# Patient Record
Sex: Male | Born: 1970 | Race: Black or African American | Hispanic: No | Marital: Single | Smoking: Never smoker
Health system: Southern US, Community
[De-identification: ages and names within clinical notes are randomized; demographics above are authoritative.]

## PROBLEM LIST (undated history)

## (undated) DIAGNOSIS — K219 Gastro-esophageal reflux disease without esophagitis: Secondary | ICD-10-CM

## (undated) HISTORY — DX: Gastro-esophageal reflux disease without esophagitis: K21.9

---

## 2016-09-18 ENCOUNTER — Emergency Department (HOSPITAL_COMMUNITY)
Admission: EM | Admit: 2016-09-18 | Discharge: 2016-09-19 | Disposition: A | Discharge status: Home / Self Care | Payer: No Typology Code available for payment source | Attending: Emergency Medicine | Admitting: Emergency Medicine

## 2016-09-18 ENCOUNTER — Encounter (HOSPITAL_COMMUNITY): Payer: Self-pay

## 2016-09-18 DIAGNOSIS — S199XXA Unspecified injury of neck, initial encounter: Secondary | ICD-10-CM | POA: Diagnosis present

## 2016-09-18 DIAGNOSIS — Y92410 Unspecified street and highway as the place of occurrence of the external cause: Secondary | ICD-10-CM | POA: Diagnosis not present

## 2016-09-18 DIAGNOSIS — F17200 Nicotine dependence, unspecified, uncomplicated: Secondary | ICD-10-CM | POA: Diagnosis not present

## 2016-09-18 DIAGNOSIS — S161XXA Strain of muscle, fascia and tendon at neck level, initial encounter: Secondary | ICD-10-CM

## 2016-09-18 DIAGNOSIS — Y999 Unspecified external cause status: Secondary | ICD-10-CM | POA: Insufficient documentation

## 2016-09-18 DIAGNOSIS — S39012A Strain of muscle, fascia and tendon of lower back, initial encounter: Secondary | ICD-10-CM

## 2016-09-18 DIAGNOSIS — Y939 Activity, unspecified: Secondary | ICD-10-CM | POA: Diagnosis not present
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
## 2016-09-19 ENCOUNTER — Emergency Department (HOSPITAL_COMMUNITY): Payer: No Typology Code available for payment source

## 2016-09-19 MED ORDER — IBUPROFEN 800 MG PO TABS
800.0000 mg | ORAL_TABLET | Freq: Once | ORAL | Status: AC
  Administered 2016-09-19: 800 mg via ORAL
  Filled 2016-09-19: qty 1

## 2016-09-19 MED ORDER — IBUPROFEN 800 MG PO TABS: 800.0000 mg | ORAL_TABLET | Freq: Three times a day (TID) | ORAL | 0 refills | Status: DC

## 2016-09-19 MED ORDER — METHOCARBAMOL 500 MG PO TABS: 500.0000 mg | ORAL_TABLET | Freq: Two times a day (BID) | ORAL | 0 refills | Status: DC
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
## 2019-04-04 ENCOUNTER — Encounter: Payer: Self-pay | Admitting: Emergency Medicine

## 2019-04-04 ENCOUNTER — Ambulatory Visit (INDEPENDENT_AMBULATORY_CARE_PROVIDER_SITE_OTHER): Payer: Self-pay | Admitting: Emergency Medicine

## 2019-04-04 ENCOUNTER — Other Ambulatory Visit: Payer: Self-pay

## 2019-04-04 VITALS — BP 103/65 | HR 60 | Temp 98.1°F | Resp 16 | Ht 67.0 in | Wt 210.8 lb

## 2019-04-04 DIAGNOSIS — Z7689 Persons encountering health services in other specified circumstances: Secondary | ICD-10-CM

## 2019-04-04 DIAGNOSIS — R4582 Worries: Secondary | ICD-10-CM

## 2019-04-04 NOTE — Patient Instructions (Addendum)
   If you have lab work done today you will be contacted with your lab results within the next 2 weeks.  If you have not heard from us then please contact us. The fastest way to get your results is to register for My Chart.   IF you received an x-ray today, you will receive an invoice from [facility]. Please contact [facility] at [phone] with questions or concerns regarding your invoice.   IF you received labwork today, you will receive an invoice from [facility]. Please contact [facility] at [phone] with questions or concerns regarding your invoice.   Our billing staff will not be able to assist you with questions regarding bills from these companies.  You will be contacted with the lab results as soon as they are available. The fastest way to get your results is to activate your My Chart account. Instructions are located on the last page of this paperwork. If you have not heard from us regarding the results in 2 weeks, please contact this office.     Health Maintenance, Male Adopting a healthy lifestyle and getting preventive care are important in promoting health and wellness. Ask your health care provider about:  The right schedule for you to have regular tests and exams.  Things you can do on your own to prevent diseases and keep yourself healthy. What should I know about diet, weight, and exercise? Eat a healthy diet   Eat a diet that includes plenty of vegetables, fruits, low-fat dairy products, and lean protein.  Do not eat a lot of foods that are high in solid fats, added sugars, or sodium. Maintain a healthy weight Body mass index (BMI) is a measurement that can be used to identify possible weight problems. It estimates body fat based on height and weight. Your health care provider can help determine your BMI and help you achieve or maintain a healthy weight. Get regular exercise Get regular exercise. This is one of the most important things you  can do for your health. Most adults should:  Exercise for at least 150 minutes each week. The exercise should increase your heart rate and make you sweat (moderate-intensity exercise).  Do strengthening exercises at least twice a week. This is in addition to the moderate-intensity exercise.  Spend less time sitting. Even light physical activity can be beneficial. Watch cholesterol and blood lipids Have your blood tested for lipids and cholesterol at 48 years of age, then have this test every 5 years. You may need to have your cholesterol levels checked more often if:  Your lipid or cholesterol levels are high.  You are older than 48 years of age.  You are at high risk for heart disease. What should I know about cancer screening? Many types of cancers can be detected early and may often be prevented. Depending on your health history and family history, you may need to have cancer screening at various ages. This may include screening for:  Colorectal cancer.  Prostate cancer.  Skin cancer.  Lung cancer. What should I know about heart disease, diabetes, and high blood pressure? Blood pressure and heart disease  High blood pressure causes heart disease and increases the risk of stroke. This is more likely to develop in people who have high blood pressure readings, are of African descent, or are overweight.  Talk with your health care provider about your target blood pressure readings.  Have your blood pressure checked: ? Every 3-5 years if you are 18-39 years   of age. ? Every year if you are 40 years old or older.  If you are between the ages of 65 and 75 and are a current or former smoker, ask your health care provider if you should have a one-time screening for abdominal aortic aneurysm (AAA). Diabetes Have regular diabetes screenings. This checks your fasting blood sugar level. Have the screening done:  Once every three years after age 45 if you are at a normal weight and have  a low risk for diabetes.  More often and at a younger age if you are overweight or have a high risk for diabetes. What should I know about preventing infection? Hepatitis B If you have a higher risk for hepatitis B, you should be screened for this virus. Talk with your health care provider to find out if you are at risk for hepatitis B infection. Hepatitis C Blood testing is recommended for:  Everyone born from 1945 through 1965.  Anyone with known risk factors for hepatitis C. Sexually transmitted infections (STIs)  You should be screened each year for STIs, including gonorrhea and chlamydia, if: ? You are sexually active and are younger than 48 years of age. ? You are older than 48 years of age and your health care provider tells you that you are at risk for this type of infection. ? Your sexual activity has changed since you were last screened, and you are at increased risk for chlamydia or gonorrhea. Ask your health care provider if you are at risk.  Ask your health care provider about whether you are at high risk for HIV. Your health care provider may recommend a prescription medicine to help prevent HIV infection. If you choose to take medicine to prevent HIV, you should first get tested for HIV. You should then be tested every 3 months for as long as you are taking the medicine. Follow these instructions at home: Lifestyle  Do not use any products that contain nicotine or tobacco, such as cigarettes, e-cigarettes, and chewing tobacco. If you need help quitting, ask your health care provider.  Do not use street drugs.  Do not share needles.  Ask your health care provider for help if you need support or information about quitting drugs. Alcohol use  Do not drink alcohol if your health care provider tells you not to drink.  If you drink alcohol: ? Limit how much you have to 0-2 drinks a day. ? Be aware of how much alcohol is in your drink. In the [place] one drink equals one 12  oz bottle of beer (355 mL), one 5 oz glass of wine (148 mL), or one 1 oz glass of hard liquor (44 mL). General instructions  Schedule regular health, dental, and eye exams.  Stay current with your vaccines.  Tell your health care provider if: ? You often feel depressed. ? You have ever been abused or do not feel safe at home. Summary  Adopting a healthy lifestyle and getting preventive care are important in promoting health and wellness.  Follow your health care provider's instructions about healthy diet, exercising, and getting tested or screened for diseases.  Follow your health care provider's instructions on monitoring your cholesterol and blood pressure. This information is not intended to replace advice given to you by your health care provider. Make sure you discuss any questions you have with your health care provider. Document Released: 01/17/2008 Document Revised: 07/14/2018 Document Reviewed: 07/14/2018 [employer]vier Patient Education  2020 [employer]  

## 2019-04-04 NOTE — Progress Notes (Signed)
Ryan Patel 48 y.o.   Chief Complaint  Patient presents with  . Establish Care    to discuss the prostate exam  . family of diabetes    HISTORY OF PRESENT ILLNESS: This is a 48 y.o. male here to establish care with me.  First visit to this office. Healthy male with a healthy lifestyle. Has no complaints or medical concerns today. Family history of diabetes.  Inquiring about prostate and colon cancer.  HPI   Prior to Admission medications   Medication Sig Start Date End Date Taking? Authorizing Provider  BLACK CURRANT SEED OIL PO Take by mouth as needed.   Yes [provider]  Omega-3 Fatty Acids (FISH OIL PO) Take by mouth as needed.   Yes [provider]  VITAMIN D PO Take by mouth as needed.   Yes [provider]  ibuprofen (ADVIL,MOTRIN) 800 MG tablet Take 1 tablet (800 mg total) by mouth 3 (three) times daily. Patient not taking: Reported on 04/04/2019 09/19/16   Frederica Kuster, PA-C  methocarbamol (ROBAXIN) 500 MG tablet Take 1 tablet (500 mg total) by mouth 2 (two) times daily. Patient not taking: Reported on 04/04/2019 09/19/16   Frederica Kuster, PA-C    Not on File  There are no active problems to display for this patient.   History reviewed. No pertinent past medical history.  History reviewed. No pertinent surgical history.  Social History   Socioeconomic History  . Marital status: Single    Spouse name: Not on file  . Number of children: Not on file  . Years of education: Not on file  . Highest education level: Not on file  Occupational History  . Not on file  Social Needs  . Financial resource strain: Not on file  . Food insecurity    Worry: Not on file    Inability: Not on file  . Transportation needs    Medical: Not on file    Non-medical: Not on file  Tobacco Use  . Smoking status: Current Every Day Smoker  . Smokeless tobacco: Never Used  Substance and Sexual Activity  . Alcohol use: No  . Drug use: Not on  file  . Sexual activity: Not on file  Lifestyle  . Physical activity    Days per week: Not on file    Minutes per session: Not on file  . Stress: Not on file  Relationships  . Social Herbalist on phone: Not on file    Gets together: Not on file    Attends religious service: Not on file    Active member of club or organization: Not on file    Attends meetings of clubs or organizations: Not on file    Relationship status: Not on file  . Intimate partner violence    Fear of current or ex partner: Not on file    Emotionally abused: Not on file    Physically abused: Not on file    Forced sexual activity: Not on file  Other Topics Concern  . Not on file  Social History Narrative  . Not on file    History reviewed. No pertinent family history.   Review of Systems  Constitutional: Negative.  Negative for chills, fever and weight loss.  HENT: Negative.  Negative for congestion and sore throat.   Eyes: Negative.   Respiratory: Negative.  Negative for cough and shortness of breath.   Cardiovascular: Negative.  Negative for chest pain and palpitations.  Gastrointestinal: Negative.  Negative for abdominal pain, blood in stool, diarrhea, melena, nausea and vomiting.  Genitourinary: Negative.  Negative for dysuria.  Musculoskeletal: Negative for myalgias and neck pain.  Skin: Negative.  Negative for rash.  Neurological: Negative for dizziness and headaches.  All other systems reviewed and are negative.  Vitals:   04/04/19 1331  BP: 103/65  Pulse: 60  Resp: 16  Temp: 98.1 F (36.7 C)  SpO2: 100%     Physical Exam Vitals signs reviewed.  Constitutional:      Appearance: Normal appearance.  HENT:     Head: Normocephalic.     Mouth/Throat:     Mouth: Mucous membranes are moist.     Pharynx: Oropharynx is clear.  Eyes:     Extraocular Movements: Extraocular movements intact.     Conjunctiva/sclera: Conjunctivae normal.     Pupils: Pupils are equal, round, and  reactive to light.  Neck:     Musculoskeletal: Normal range of motion and neck supple.  Cardiovascular:     Rate and Rhythm: Normal rate and regular rhythm.     Pulses: Normal pulses.     Heart sounds: Normal heart sounds.  Pulmonary:     Effort: Pulmonary effort is normal.     Breath sounds: Normal breath sounds.  Abdominal:     Palpations: Abdomen is soft. There is no mass.     Tenderness: There is no abdominal tenderness. There is no guarding.  Musculoskeletal: Normal range of motion.     Right lower leg: No edema.     Left lower leg: No edema.  Skin:    General: Skin is warm and dry.     Capillary Refill: Capillary refill takes less than 2 seconds.  Neurological:     General: No focal deficit present.     Mental Status: He is alert and oriented to person, place, and time.  Psychiatric:        Mood and Affect: Mood normal.        Behavior: Behavior normal.      ASSESSMENT & PLAN: Terald was seen today for establish care and family of diabetes.  Diagnoses and all orders for this visit:  Worries  Encounter to establish care    Patient Instructions       If you have lab work done today you will be contacted with your lab results within the next 2 weeks.  If you have not heard from [place] then please contact us. The fastest way to get your results is to register for My Chart.   IF you received an x-ray today, you will receive an invoice from [facility]. Please contact [facility] at [phone] with questions or concerns regarding your invoice.   IF you received labwork today, you will receive an invoice from [place]. Please contact [facility] at [phone] with questions or concerns regarding your invoice.   Our billing staff will not be able to assist you with questions regarding bills from these companies.  You will be contacted with the lab results as soon as they are available. The fastest way to get your results is to activate your My  Chart account. Instructions are located on the last page of this paperwork. If you have not heard from [place] regarding the results in 2 weeks, please contact this office.     Health Maintenance, Male Adopting a healthy lifestyle and getting preventive care are important in promoting health and wellness. Ask your health care provider about:  The right  schedule for you to have regular tests and exams.  Things you can do on your own to prevent diseases and keep yourself healthy. What should I know about diet, weight, and exercise? Eat a healthy diet   Eat a diet that includes plenty of vegetables, fruits, low-fat dairy products, and lean protein.  Do not eat a lot of foods that are high in solid fats, added sugars, or sodium. Maintain a healthy weight Body mass index (BMI) is a measurement that can be used to identify possible weight problems. It estimates body fat based on height and weight. Your health care provider can help determine your BMI and help you achieve or maintain a healthy weight. Get regular exercise Get regular exercise. This is one of the most important things you can do for your health. Most adults should:  Exercise for at least 150 minutes each week. The exercise should increase your heart rate and make you sweat (moderate-intensity exercise).  Do strengthening exercises at least twice a week. This is in addition to the moderate-intensity exercise.  Spend less time sitting. Even light physical activity can be beneficial. Watch cholesterol and blood lipids Have your blood tested for lipids and cholesterol at 48 years of age, then have this test every 5 years. You may need to have your cholesterol levels checked more often if:  Your lipid or cholesterol levels are high.  You are older than 48 years of age.  You are at high risk for heart disease. What should I know about cancer screening? Many types of cancers can be detected early and may often be prevented.  Depending on your health history and family history, you may need to have cancer screening at various ages. This may include screening for:  Colorectal cancer.  Prostate cancer.  Skin cancer.  Lung cancer. What should I know about heart disease, diabetes, and high blood pressure? Blood pressure and heart disease  High blood pressure causes heart disease and increases the risk of stroke. This is more likely to develop in people who have high blood pressure readings, are of African descent, or are overweight.  Talk with your health care provider about your target blood pressure readings.  Have your blood pressure checked: ? Every 3-5 years if you are 22-81 years of age. ? Every year if you are 50 years old or older.  If you are between the ages of 29 and 80 and are a current or former smoker, ask your health care provider if you should have a one-time screening for abdominal aortic aneurysm (AAA). Diabetes Have regular diabetes screenings. This checks your fasting blood sugar level. Have the screening done:  Once every three years after age 48 if you are at a normal weight and have a low risk for diabetes.  More often and at a younger age if you are overweight or have a high risk for diabetes. What should I know about preventing infection? Hepatitis B If you have a higher risk for hepatitis B, you should be screened for this virus. Talk with your health care provider to find out if you are at risk for hepatitis B infection. Hepatitis C Blood testing is recommended for:  Everyone born from [birth year] through 1965.  Anyone with known risk factors for hepatitis C. Sexually transmitted infections (STIs)  You should be screened each year for STIs, including gonorrhea and chlamydia, if: ? You are sexually active and are younger than 48 years of age. ? You are older than 48 years  of age and your health care provider tells you that you are at risk for this type of infection. ? Your sexual  activity has changed since you were last screened, and you are at increased risk for chlamydia or gonorrhea. Ask your health care provider if you are at risk.  Ask your health care provider about whether you are at high risk for HIV. Your health care provider may recommend a prescription medicine to help prevent HIV infection. If you choose to take medicine to prevent HIV, you should first get tested for HIV. You should then be tested every 3 months for as long as you are taking the medicine. Follow these instructions at home: Lifestyle  Do not use any products that contain nicotine or tobacco, such as cigarettes, e-cigarettes, and chewing tobacco. If you need help quitting, ask your health care provider.  Do not use street drugs.  Do not share needles.  Ask your health care provider for help if you need support or information about quitting drugs. Alcohol use  Do not drink alcohol if your health care provider tells you not to drink.  If you drink alcohol: ? Limit how much you have to 0-2 drinks a day. ? Be aware of how much alcohol is in your drink. In the [place] one drink equals one 12 oz bottle of beer (355 mL), one 5 oz glass of wine (148 mL), or one 1 oz glass of hard liquor (44 mL). General instructions  Schedule regular health, dental, and eye exams.  Stay current with your vaccines.  Tell your health care provider if: ? You often feel depressed. ? You have ever been abused or do not feel safe at home. Summary  Adopting a healthy lifestyle and getting preventive care are important in promoting health and wellness.  Follow your health care provider's instructions about healthy diet, exercising, and getting tested or screened for diseases.  Follow your health care provider's instructions on monitoring your cholesterol and blood pressure. This information is not intended to replace advice given to you by your health care provider. Make sure you discuss any questions you have  with your health care provider. Document Released: 01/17/2008 Document Revised: 07/14/2018 Document Reviewed: 07/14/2018 Elsevier Patient Education  2020 Elsevier Inc.      Agustina Caroli, MD [facility]

## 2019-04-06 ENCOUNTER — Ambulatory Visit (INDEPENDENT_AMBULATORY_CARE_PROVIDER_SITE_OTHER): Payer: Self-pay | Admitting: Emergency Medicine

## 2019-04-06 ENCOUNTER — Encounter: Payer: Self-pay | Admitting: Emergency Medicine

## 2019-04-06 ENCOUNTER — Other Ambulatory Visit: Payer: Self-pay

## 2019-04-06 VITALS — BP 110/60 | HR 60 | Temp 97.9°F | Ht 66.0 in | Wt 206.7 lb

## 2019-04-06 DIAGNOSIS — Z0000 Encounter for general adult medical examination without abnormal findings: Secondary | ICD-10-CM

## 2019-04-06 DIAGNOSIS — Z13228 Encounter for screening for other metabolic disorders: Secondary | ICD-10-CM

## 2019-04-06 DIAGNOSIS — Z1329 Encounter for screening for other suspected endocrine disorder: Secondary | ICD-10-CM

## 2019-04-06 DIAGNOSIS — Z125 Encounter for screening for malignant neoplasm of prostate: Secondary | ICD-10-CM

## 2019-04-06 DIAGNOSIS — Z130 Encounter for screening for diseases of the blood and blood-forming organs and certain disorders involving the immune mechanism: Secondary | ICD-10-CM

## 2019-04-06 DIAGNOSIS — Z13220 Encounter for screening for lipoid disorders: Secondary | ICD-10-CM

## 2019-04-06 NOTE — Progress Notes (Signed)
Donia Ast 48 y.o.   Chief Complaint  Patient presents with  . Annual Exam    CPE    HISTORY OF PRESENT ILLNESS: This is a 48 y.o. male here for his annual exam. No chronic medical problems with no chronic medications. Healthy male with a healthy lifestyle. No complaints or medical concerns today.  HPI   Prior to Admission medications   Medication Sig Start Date End Date Taking? Authorizing Provider  BLACK CURRANT SEED OIL PO Take by mouth as needed.   Yes [provider]  Omega-3 Fatty Acids (FISH OIL PO) Take by mouth as needed.   Yes [provider]  VITAMIN D PO Take by mouth as needed.   Yes [provider]    No Known Allergies  There are no active problems to display for this patient.   History reviewed. No pertinent past medical history.  History reviewed. No pertinent surgical history.  Social History   Socioeconomic History  . Marital status: Single    Spouse name: Not on file  . Number of children: Not on file  . Years of education: Not on file  . Highest education level: Not on file  Occupational History  . Not on file  Social Needs  . Financial resource strain: Not on file  . Food insecurity    Worry: Not on file    Inability: Not on file  . Transportation needs    Medical: Not on file    Non-medical: Not on file  Tobacco Use  . Smoking status: Current Every Day Smoker  . Smokeless tobacco: Never Used  Substance and Sexual Activity  . Alcohol use: No  . Drug use: Not on file  . Sexual activity: Not on file  Lifestyle  . Physical activity    Days per week: Not on file    Minutes per session: Not on file  . Stress: Not on file  Relationships  . Social Herbalist on phone: Not on file    Gets together: Not on file    Attends religious service: Not on file    Active member of club or organization: Not on file    Attends meetings of clubs or organizations: Not on file    Relationship status: Not  on file  . Intimate partner violence    Fear of current or ex partner: Not on file    Emotionally abused: Not on file    Physically abused: Not on file    Forced sexual activity: Not on file  Other Topics Concern  . Not on file  Social History Narrative  . Not on file    History reviewed. No pertinent family history.   Review of Systems  Constitutional: Negative.  Negative for chills, fever and weight loss.  HENT: Negative.  Negative for congestion and sore throat.   Eyes: Negative.  Negative for blurred vision and double vision.  Respiratory: Negative.  Negative for cough and shortness of breath.   Cardiovascular: Negative.  Negative for chest pain, palpitations and leg swelling.  Gastrointestinal: Negative.  Negative for abdominal pain, blood in stool, diarrhea, melena, nausea and vomiting.  Genitourinary: Negative.  Negative for dysuria and hematuria.  Musculoskeletal: Negative for back pain, myalgias and neck pain.  Skin: Negative.  Negative for rash.  Neurological: Negative.  Negative for dizziness and headaches.  Endo/Heme/Allergies: Negative.   All other systems reviewed and are negative.  Vitals:   04/06/19 0849  BP: 110/60  Pulse:  60  Temp: 97.9 F (36.6 C)  SpO2: 95%     Physical Exam Vitals signs reviewed.  Constitutional:      Appearance: Normal appearance.  HENT:     Head: Normocephalic.  Eyes:     Extraocular Movements: Extraocular movements intact.     Conjunctiva/sclera: Conjunctivae normal.     Pupils: Pupils are equal, round, and reactive to light.  Neck:     Musculoskeletal: Normal range of motion and neck supple.  Cardiovascular:     Rate and Rhythm: Normal rate and regular rhythm.     Pulses: Normal pulses.     Heart sounds: Normal heart sounds.  Pulmonary:     Effort: Pulmonary effort is normal.     Breath sounds: Normal breath sounds.  Abdominal:     General: There is no distension.     Palpations: Abdomen is soft. There is no mass.      Tenderness: There is no abdominal tenderness. There is no guarding.  Musculoskeletal: Normal range of motion.  Skin:    General: Skin is warm and dry.     Capillary Refill: Capillary refill takes less than 2 seconds.  Neurological:     General: No focal deficit present.     Mental Status: He is alert and oriented to person, place, and time.  Psychiatric:        Mood and Affect: Mood normal.        Behavior: Behavior normal.      ASSESSMENT & PLAN: Nahzir was seen today for annual exam.  Diagnoses and all orders for this visit:  Routine general medical examination at a health care facility  Prostate cancer screening -     PSA(Must document that pt has been informed of limitations of PSA testing.)  Screening for deficiency anemia -     CBC with Differential  Screening for lipoid disorders -     Lipid panel  Screening for endocrine, metabolic and immunity disorder -     Hemoglobin A1c -     Comprehensive metabolic panel    Patient Instructions       If you have lab work done today you will be contacted with your lab results within the next 2 weeks.  If you have not heard from [place] then please contact us. The fastest way to get your results is to register for My Chart.   IF you received an x-ray today, you will receive an invoice from [facility]. Please contact [facility] at [phone] with questions or concerns regarding your invoice.   IF you received labwork today, you will receive an invoice from [place]. Please contact [facility] at [phone] with questions or concerns regarding your invoice.   Our billing staff will not be able to assist you with questions regarding bills from these companies.  You will be contacted with the lab results as soon as they are available. The fastest way to get your results is to activate your My Chart account. Instructions are located on the last page of this paperwork. If you have not heard from [place]  regarding the results in 2 weeks, please contact this office.       Health Maintenance, Male Adopting a healthy lifestyle and getting preventive care are important in promoting health and wellness. Ask your health care provider about:  The right schedule for you to have regular tests and exams.  Things you can do on your own to prevent diseases and keep yourself healthy. What should I  know about diet, weight, and exercise? Eat a healthy diet   Eat a diet that includes plenty of vegetables, fruits, low-fat dairy products, and lean protein.  Do not eat a lot of foods that are high in solid fats, added sugars, or sodium. Maintain a healthy weight Body mass index (BMI) is a measurement that can be used to identify possible weight problems. It estimates body fat based on height and weight. Your health care provider can help determine your BMI and help you achieve or maintain a healthy weight. Get regular exercise Get regular exercise. This is one of the most important things you can do for your health. Most adults should:  Exercise for at least 150 minutes each week. The exercise should increase your heart rate and make you sweat (moderate-intensity exercise).  Do strengthening exercises at least twice a week. This is in addition to the moderate-intensity exercise.  Spend less time sitting. Even light physical activity can be beneficial. Watch cholesterol and blood lipids Have your blood tested for lipids and cholesterol at 48 years of age, then have this test every 5 years. You may need to have your cholesterol levels checked more often if:  Your lipid or cholesterol levels are high.  You are older than 48 years of age.  You are at high risk for heart disease. What should I know about cancer screening? Many types of cancers can be detected early and may often be prevented. Depending on your health history and family history, you may need to have cancer screening at various ages.  This may include screening for:  Colorectal cancer.  Prostate cancer.  Skin cancer.  Lung cancer. What should I know about heart disease, diabetes, and high blood pressure? Blood pressure and heart disease  High blood pressure causes heart disease and increases the risk of stroke. This is more likely to develop in people who have high blood pressure readings, are of African descent, or are overweight.  Talk with your health care provider about your target blood pressure readings.  Have your blood pressure checked: ? Every 3-5 years if you are 23-39 years of age. ? Every year if you are 11 years old or older.  If you are between the ages of 45 and 52 and are a current or former smoker, ask your health care provider if you should have a one-time screening for abdominal aortic aneurysm (AAA). Diabetes Have regular diabetes screenings. This checks your fasting blood sugar level. Have the screening done:  Once every three years after age 38 if you are at a normal weight and have a low risk for diabetes.  More often and at a younger age if you are overweight or have a high risk for diabetes. What should I know about preventing infection? Hepatitis B If you have a higher risk for hepatitis B, you should be screened for this virus. Talk with your health care provider to find out if you are at risk for hepatitis B infection. Hepatitis C Blood testing is recommended for:  Everyone born from [birth year] through 1965.  Anyone with known risk factors for hepatitis C. Sexually transmitted infections (STIs)  You should be screened each year for STIs, including gonorrhea and chlamydia, if: ? You are sexually active and are younger than 48 years of age. ? You are older than 48 years of age and your health care provider tells you that you are at risk for this type of infection. ? Your sexual activity has changed since you  were last screened, and you are at increased risk for chlamydia or gonorrhea.  Ask your health care provider if you are at risk.  Ask your health care provider about whether you are at high risk for HIV. Your health care provider may recommend a prescription medicine to help prevent HIV infection. If you choose to take medicine to prevent HIV, you should first get tested for HIV. You should then be tested every 3 months for as long as you are taking the medicine. Follow these instructions at home: Lifestyle  Do not use any products that contain nicotine or tobacco, such as cigarettes, e-cigarettes, and chewing tobacco. If you need help quitting, ask your health care provider.  Do not use street drugs.  Do not share needles.  Ask your health care provider for help if you need support or information about quitting drugs. Alcohol use  Do not drink alcohol if your health care provider tells you not to drink.  If you drink alcohol: ? Limit how much you have to 0-2 drinks a day. ? Be aware of how much alcohol is in your drink. In the [place] one drink equals one 12 oz bottle of beer (355 mL), one 5 oz glass of wine (148 mL), or one 1 oz glass of hard liquor (44 mL). General instructions  Schedule regular health, dental, and eye exams.  Stay current with your vaccines.  Tell your health care provider if: ? You often feel depressed. ? You have ever been abused or do not feel safe at home. Summary  Adopting a healthy lifestyle and getting preventive care are important in promoting health and wellness.  Follow your health care provider's instructions about healthy diet, exercising, and getting tested or screened for diseases.  Follow your health care provider's instructions on monitoring your cholesterol and blood pressure. This information is not intended to replace advice given to you by your health care provider. Make sure you discuss any questions you have with your health care provider. Document Released: 01/17/2008 Document Revised: 07/14/2018 Document Reviewed:  07/14/2018 Elsevier Patient Education  2020 Elsevier Inc.      Agustina Caroli, MD [facility]

## 2019-04-06 NOTE — Patient Instructions (Addendum)
   If you have lab work done today you will be contacted with your lab results within the next 2 weeks.  If you have not heard from us then please contact us. The fastest way to get your results is to register for My Chart.   IF you received an x-ray today, you will receive an invoice from [facility]. Please contact [facility] at [phone] with questions or concerns regarding your invoice.   IF you received labwork today, you will receive an invoice from [facility]. Please contact [facility] at [phone] with questions or concerns regarding your invoice.   Our billing staff will not be able to assist you with questions regarding bills from these companies.  You will be contacted with the lab results as soon as they are available. The fastest way to get your results is to activate your My Chart account. Instructions are located on the last page of this paperwork. If you have not heard from us regarding the results in 2 weeks, please contact this office.     Health Maintenance, Male Adopting a healthy lifestyle and getting preventive care are important in promoting health and wellness. Ask your health care provider about:  The right schedule for you to have regular tests and exams.  Things you can do on your own to prevent diseases and keep yourself healthy. What should I know about diet, weight, and exercise? Eat a healthy diet   Eat a diet that includes plenty of vegetables, fruits, low-fat dairy products, and lean protein.  Do not eat a lot of foods that are high in solid fats, added sugars, or sodium. Maintain a healthy weight Body mass index (BMI) is a measurement that can be used to identify possible weight problems. It estimates body fat based on height and weight. Your health care provider can help determine your BMI and help you achieve or maintain a healthy weight. Get regular exercise Get regular exercise. This is one of the most important things you  can do for your health. Most adults should:  Exercise for at least 150 minutes each week. The exercise should increase your heart rate and make you sweat (moderate-intensity exercise).  Do strengthening exercises at least twice a week. This is in addition to the moderate-intensity exercise.  Spend less time sitting. Even light physical activity can be beneficial. Watch cholesterol and blood lipids Have your blood tested for lipids and cholesterol at 48 years of age, then have this test every 5 years. You may need to have your cholesterol levels checked more often if:  Your lipid or cholesterol levels are high.  You are older than 48 years of age.  You are at high risk for heart disease. What should I know about cancer screening? Many types of cancers can be detected early and may often be prevented. Depending on your health history and family history, you may need to have cancer screening at various ages. This may include screening for:  Colorectal cancer.  Prostate cancer.  Skin cancer.  Lung cancer. What should I know about heart disease, diabetes, and high blood pressure? Blood pressure and heart disease  High blood pressure causes heart disease and increases the risk of stroke. This is more likely to develop in people who have high blood pressure readings, are of African descent, or are overweight.  Talk with your health care provider about your target blood pressure readings.  Have your blood pressure checked: ? Every 3-5 years if you are 18-39 years   of age. ? Every year if you are 40 years old or older.  If you are between the ages of 65 and 75 and are a current or former smoker, ask your health care provider if you should have a one-time screening for abdominal aortic aneurysm (AAA). Diabetes Have regular diabetes screenings. This checks your fasting blood sugar level. Have the screening done:  Once every three years after age 45 if you are at a normal weight and have  a low risk for diabetes.  More often and at a younger age if you are overweight or have a high risk for diabetes. What should I know about preventing infection? Hepatitis B If you have a higher risk for hepatitis B, you should be screened for this virus. Talk with your health care provider to find out if you are at risk for hepatitis B infection. Hepatitis C Blood testing is recommended for:  Everyone born from 1945 through 1965.  Anyone with known risk factors for hepatitis C. Sexually transmitted infections (STIs)  You should be screened each year for STIs, including gonorrhea and chlamydia, if: ? You are sexually active and are younger than 48 years of age. ? You are older than 48 years of age and your health care provider tells you that you are at risk for this type of infection. ? Your sexual activity has changed since you were last screened, and you are at increased risk for chlamydia or gonorrhea. Ask your health care provider if you are at risk.  Ask your health care provider about whether you are at high risk for HIV. Your health care provider may recommend a prescription medicine to help prevent HIV infection. If you choose to take medicine to prevent HIV, you should first get tested for HIV. You should then be tested every 3 months for as long as you are taking the medicine. Follow these instructions at home: Lifestyle  Do not use any products that contain nicotine or tobacco, such as cigarettes, e-cigarettes, and chewing tobacco. If you need help quitting, ask your health care provider.  Do not use street drugs.  Do not share needles.  Ask your health care provider for help if you need support or information about quitting drugs. Alcohol use  Do not drink alcohol if your health care provider tells you not to drink.  If you drink alcohol: ? Limit how much you have to 0-2 drinks a day. ? Be aware of how much alcohol is in your drink. In the [place] one drink equals one 12  oz bottle of beer (355 mL), one 5 oz glass of wine (148 mL), or one 1 oz glass of hard liquor (44 mL). General instructions  Schedule regular health, dental, and eye exams.  Stay current with your vaccines.  Tell your health care provider if: ? You often feel depressed. ? You have ever been abused or do not feel safe at home. Summary  Adopting a healthy lifestyle and getting preventive care are important in promoting health and wellness.  Follow your health care provider's instructions about healthy diet, exercising, and getting tested or screened for diseases.  Follow your health care provider's instructions on monitoring your cholesterol and blood pressure. This information is not intended to replace advice given to you by your health care provider. Make sure you discuss any questions you have with your health care provider. Document Released: 01/17/2008 Document Revised: 07/14/2018 Document Reviewed: 07/14/2018 Elsevier Patient Education  2020 [employer] Inc[employer]  

## 2019-04-07 LAB — COMPREHENSIVE METABOLIC PANEL
ALT: 25 IU/L (ref 0–44)
AST: 25 IU/L (ref 0–40)
Albumin/Globulin Ratio: 1.8 (ref 1.2–2.2)
Albumin: 4.5 g/dL (ref 4.0–5.0)
Alkaline Phosphatase: 72 IU/L (ref 39–117)
BUN/Creatinine Ratio: 13 (ref 9–20)
BUN: 12 mg/dL (ref 6–24)
Bilirubin Total: 0.2 mg/dL (ref 0.0–1.2)
CO2: 22 mmol/L (ref 20–29)
Calcium: 9.8 mg/dL (ref 8.7–10.2)
Chloride: 104 mmol/L (ref 96–106)
Creatinine, Ser: 0.96 mg/dL (ref 0.76–1.27)
GFR calc Af Amer: 108 mL/min/{1.73_m2} (ref >59)
GFR calc non Af Amer: 94 mL/min/{1.73_m2} (ref >59)
Globulin, Total: 2.5 g/dL (ref 1.5–4.5)
Glucose: 88 mg/dL (ref 65–99)
Potassium: 4.6 mmol/L (ref 3.5–5.2)
Sodium: 139 mmol/L (ref 134–144)
Total Protein: 7 g/dL (ref 6.0–8.5)

## 2019-04-07 LAB — LIPID PANEL
Chol/HDL Ratio: 2.8 ratio (ref 0.0–5.0)
Cholesterol, Total: 156 mg/dL (ref 100–199)
HDL: 55 mg/dL (ref >39)
LDL Chol Calc (NIH): 88 mg/dL (ref 0–99)
Triglycerides: 66 mg/dL (ref 0–149)
VLDL Cholesterol Cal: 13 mg/dL (ref 5–40)

## 2019-04-07 LAB — CBC WITH DIFFERENTIAL/PLATELET
Basophils Absolute: 0.1 10*3/uL (ref 0.0–0.2)
Basos: 1 %
EOS (ABSOLUTE): 0.1 10*3/uL (ref 0.0–0.4)
Eos: 2 %
Hematocrit: 41.3 % (ref 37.5–51.0)
Hemoglobin: 13.2 g/dL (ref 13.0–17.7)
Immature Grans (Abs): 0 10*3/uL (ref 0.0–0.1)
Immature Granulocytes: 0 %
Lymphocytes Absolute: 2.5 10*3/uL (ref 0.7–3.1)
Lymphs: 34 %
MCH: 28.6 pg (ref 26.6–33.0)
MCHC: 32 g/dL (ref 31.5–35.7)
MCV: 89 fL (ref 79–97)
Monocytes Absolute: 0.5 10*3/uL (ref 0.1–0.9)
Monocytes: 7 %
Neutrophils Absolute: 4 10*3/uL (ref 1.4–7.0)
Neutrophils: 56 %
Platelets: 258 10*3/uL (ref 150–450)
RBC: 4.62 x10E6/uL (ref 4.14–5.80)
RDW: 13.6 % (ref 11.6–15.4)
WBC: 7.2 10*3/uL (ref 3.4–10.8)

## 2019-04-07 LAB — HEMOGLOBIN A1C
Est. average glucose Bld gHb Est-mCnc: 114 mg/dL
Hgb A1c MFr Bld: 5.6 % (ref 4.8–5.6)

## 2019-04-07 LAB — PSA: Prostate Specific Ag, Serum: 0.5 ng/mL (ref 0.0–4.0)

## 2021-11-21 ENCOUNTER — Encounter: Payer: Self-pay | Admitting: Gastroenterology

## 2021-12-09 ENCOUNTER — Ambulatory Visit (AMBULATORY_SURGERY_CENTER): Payer: Self-pay | Admitting: *Deleted

## 2021-12-09 VITALS — Ht 66.0 in | Wt 218.0 lb

## 2021-12-09 DIAGNOSIS — Z1211 Encounter for screening for malignant neoplasm of colon: Secondary | ICD-10-CM

## 2021-12-09 NOTE — Progress Notes (Signed)
No egg or soy allergy known to patient  ?No issues known to pt with past sedation with any surgeries or procedures ?Patient denies ever being told they had issues or difficulty with intubation  ?No FH of Malignant Hyperthermia ?Pt is not on diet pills ?Pt is not on  home 02  ?Pt is not on blood thinners  ?Pt denies issues with constipation  ?No A fib or A flutter ? ? ?PV completed in person. Pt verified name, DOB, address and insurance during PV today.  ?Pt given instruction packet with copy of consent form to read and sign. ?Pt encouraged to call with questions or issues.  ? ?Insurance confirmed with pt at [facility] today   ?

## 2021-12-20 ENCOUNTER — Encounter: Payer: Self-pay | Admitting: Gastroenterology

## 2021-12-23 ENCOUNTER — Encounter: Payer: Self-pay | Admitting: Gastroenterology

## 2021-12-23 ENCOUNTER — Ambulatory Visit (AMBULATORY_SURGERY_CENTER): Payer: No Typology Code available for payment source | Admitting: Gastroenterology

## 2021-12-23 VITALS — BP 114/63 | HR 46 | Temp 97.3°F | Resp 12 | Ht 66.0 in | Wt 218.0 lb

## 2021-12-23 DIAGNOSIS — Z1211 Encounter for screening for malignant neoplasm of colon: Secondary | ICD-10-CM

## 2021-12-23 DIAGNOSIS — K6389 Other specified diseases of intestine: Secondary | ICD-10-CM | POA: Diagnosis not present

## 2021-12-23 DIAGNOSIS — D124 Benign neoplasm of descending colon: Secondary | ICD-10-CM

## 2021-12-23 DIAGNOSIS — K635 Polyp of colon: Secondary | ICD-10-CM

## 2021-12-23 MED ORDER — SODIUM CHLORIDE 0.9 % IV SOLN: 500.0000 mL | Freq: Once | INTRAVENOUS | Status: DC

## 2021-12-23 NOTE — Progress Notes (Signed)
Called to room to assist during endoscopic procedure.  Patient ID and intended procedure confirmed with present staff. Received instructions for my participation in the procedure from the performing physician.  

## 2021-12-23 NOTE — Progress Notes (Signed)
To pacu VSS. Report to [email or web address] 

## 2021-12-23 NOTE — Op Note (Signed)
[place] Patient Name: Ryan Patel Procedure Date: 12/23/2021 1:49 PM MRN: [id number] Endoscopist: Thornton Park MD, MD Age: 51 Referring MD:  Date of Birth: 08-11-1970 Gender: Male Account #: [id number] Procedure:                Colonoscopy Indications:              Screening for colorectal malignant neoplasm, This                            is the patient's first colonoscopy Medicines:                Monitored Anesthesia Care Procedure:                Pre-Anesthesia Assessment:                           - Prior to the procedure, a History and Physical                            was performed, and patient medications and                            allergies were reviewed. The patient's tolerance of                            previous anesthesia was also reviewed. The risks                            and benefits of the procedure and the sedation                            options and risks were discussed with the patient.                            All questions were answered, and informed consent                            was obtained. Prior Anticoagulants: The patient has                            taken no previous anticoagulant or antiplatelet                            agents. ASA Grade Assessment: II - A patient with                            mild systemic disease. After reviewing the risks                            and benefits, the patient was deemed in                            satisfactory condition to undergo the procedure.  After obtaining informed consent, the colonoscope                            was passed under direct vision. Throughout the                            procedure, the patient's blood pressure, pulse, and                            oxygen saturations were monitored continuously. The                            Olympus CF-HQ190L [id number]) Colonoscope was                            introduced through the  anus and advanced to the 3                            cm into the ileum. A second forward view of the                            right colon was performed. The colonoscopy was                            performed without difficulty. The patient tolerated                            the procedure well. The quality of the bowel                            preparation was good. The terminal ileum, ileocecal                            valve, appendiceal orifice, and rectum were                            photographed. Scope In: 2:47:06 PM Scope Out: 3:02:41 PM Scope Withdrawal Time: 0 hours 13 minutes 7 seconds  Total Procedure Duration: 0 hours 15 minutes 35 seconds  Findings:                 The perianal and digital rectal examinations were                            normal.                           Non-bleeding internal hemorrhoids were found.                           A few small-mouthed diverticula were found in the                            sigmoid colon and descending colon.  A 3 mm polyp was found in the descending colon. The                            polyp was flat. The polyp was removed with a cold                            snare. Resection and retrieval were complete.                            Estimated blood loss was minimal.                           The proximal portion of the ileocecal valve has a                            slightly adenomatous appearance. Biopsies were                            taken with a cold forceps for histology. Estimated                            blood loss was minimal.                           The exam was otherwise without abnormality on                            direct and retroflexion views. Complications:            No immediate complications. Estimated Blood Loss:     Estimated blood loss was minimal. Impression:               - Non-bleeding internal hemorrhoids.                           - Diverticulosis in the  sigmoid colon and in the                            descending colon.                           - One 3 mm polyp in the descending colon, removed                            with a cold snare. Resected and retrieved.                           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                            Biopsied.                           - The examination was otherwise normal on direct  and retroflexion views. Recommendation:           - Patient has a contact number available for                            emergencies. The signs and symptoms of potential                            delayed complications were discussed with the                            patient. Return to normal activities tomorrow.                            Written discharge instructions were provided to the                            patient.                           - High fiber diet.                           - Continue present medications.                           - Await pathology results.                           - Repeat colonoscopy date to be determined after                            pending pathology results are reviewed for                            surveillance. Surveillance colonoscopy in 5 years                            if father is found to have colon cancer.                           - Emerging evidence supports eating a diet of                            fruits, vegetables, grains, calcium, and yogurt                            while reducing red meat and alcohol may reduce the                            risk of colon cancer.                           - Thank you for allowing me to be involved in your  colon cancer prevention. Thornton Park MD, MD 12/23/2021 3:09:00 PM This report has been signed electronically.

## 2021-12-23 NOTE — Progress Notes (Signed)
   Referring Provider: Horald Pollen, * Primary Care Physician:  Horald Pollen, MD  Indication for Procedure:  Colon cancer screening   IMPRESSION:  Need for colon cancer screening Father with colon cancer Appropriate candidate for monitored anesthesia care  PLAN: Colonoscopy in the [place] today   HPI: Ryan Patel is a 50 y.o. male presents for screening colonoscopy.  No prior colonoscopy or colon cancer screening.  No baseline GI symptoms.   Father with colon cancer. No other known family history of colon cancer or polyps. No family history of uterine/endometrial cancer, pancreatic cancer or gastric/stomach cancer.   Past Medical History:  Diagnosis Date   GERD (gastroesophageal reflux disease)     No past surgical history on file.  Current Outpatient Medications  Medication Sig Dispense Refill   BLACK CURRANT SEED OIL PO Take by mouth as needed.     Omega-3 Fatty Acids (FISH OIL PO) Take by mouth as needed.     VITAMIN D PO Take by mouth as needed.     No current facility-administered medications for this visit.    Allergies as of 12/23/2021   (No Known Allergies)    Family History  Problem Relation Age of Onset   Colon cancer Father    Colon cancer Paternal Grandfather    Colon polyps Neg Hx    Esophageal cancer Neg Hx    Stomach cancer Neg Hx    Rectal cancer Neg Hx      Physical Exam: General:   Alert,  well-nourished, pleasant and cooperative in NAD Head:  Normocephalic and atraumatic. Eyes:  Sclera clear, no icterus.   Conjunctiva pink. Mouth:  No deformity or lesions.   Neck:  Supple; no masses or thyromegaly. Lungs:  Clear throughout to auscultation.   No wheezes. Heart:  Regular rate and rhythm; no murmurs. Abdomen:  Soft, non-tender, nondistended, normal bowel sounds, no rebound or guarding.  Msk:  Symmetrical. No boney deformities LAD: No inguinal or umbilical LAD Extremities:  No clubbing or edema. Neurologic:  Alert and   oriented x4;  grossly nonfocal Skin:  No obvious rash or bruise. Psych:  Alert and cooperative. Normal mood and affect.     Studies/Results: No results found.    Kyleen Villatoro L. Tarri Glenn, MD, MPH 12/23/2021, 1:12 PM

## 2021-12-23 NOTE — Patient Instructions (Signed)
Handouts on hemorrhoids, diverticulosis, polyps, and high fiber diet given to patient. Await pathology results.  Resume previous diet and continue present medications. Repeat colonoscopy for surveillance will be determined based off of pathology results.   YOU HAD AN ENDOSCOPIC PROCEDURE TODAY AT [facility]:   Refer to the procedure report that was given to you for any specific questions about what was found during the examination.  If the procedure report does not answer your questions, please call your gastroenterologist to clarify.  If you requested that your care partner not be given the details of your procedure findings, then the procedure report has been included in a sealed envelope for you to review at your convenience later.  YOU SHOULD EXPECT: Some feelings of bloating in the abdomen. Passage of more gas than usual.  Walking can help get rid of the air that was put into your GI tract during the procedure and reduce the bloating. If you had a lower endoscopy (such as a colonoscopy or flexible sigmoidoscopy) you may notice spotting of blood in your stool or on the toilet paper. If you underwent a bowel prep for your procedure, you may not have a normal bowel movement for a few days.  Please Note:  You might notice some irritation and congestion in your nose or some drainage.  This is from the oxygen used during your procedure.  There is no need for concern and it should clear up in a day or so.  SYMPTOMS TO REPORT IMMEDIATELY:  Following lower endoscopy (colonoscopy or flexible sigmoidoscopy):  Excessive amounts of blood in the stool  Significant tenderness or worsening of abdominal pains  Swelling of the abdomen that is new, acute  Fever of 100F or higher  For urgent or emergent issues, a gastroenterologist can be reached at any hour by calling [phone]. Do not use MyChart messaging for urgent concerns.    DIET:  We do recommend a small meal at first, but  then you may proceed to your regular diet.  Drink plenty of fluids but you should avoid alcoholic beverages for 24 hours.  ACTIVITY:  You should plan to take it easy for the rest of today and you should NOT DRIVE or use heavy machinery until tomorrow (because of the sedation medicines used during the test).    FOLLOW UP: Our staff will call the number listed on your records 48-72 hours following your procedure to check on you and address any questions or concerns that you may have regarding the information given to you following your procedure. If we do not reach you, we will leave a message.  We will attempt to reach you two times.  During this call, we will ask if you have developed any symptoms of COVID 19. If you develop any symptoms (ie: fever, flu-like symptoms, shortness of breath, cough etc.) before then, please call [phone].  If you test positive for Covid 19 in the 2 weeks post procedure, please call and report this information to [place].    If any biopsies were taken you will be contacted by phone or by letter within the next 1-3 weeks.  Please call us at [phone] if you have not heard about the biopsies in 3 weeks.    SIGNATURES/CONFIDENTIALITY: You and/or your care partner have signed paperwork which will be entered into your electronic medical record.  These signatures attest to the fact that that the information above on your After Visit Summary has been reviewed and is  understood.  Full responsibility of the confidentiality of this discharge information lies with you and/or your care-partner.

## 2021-12-23 NOTE — Progress Notes (Signed)
I have reviewed the patient's medical history in detail and updated the computerized patient record.   VS BY CW. 

## 2021-12-24 ENCOUNTER — Telehealth: Payer: Self-pay

## 2021-12-24 NOTE — Telephone Encounter (Signed)
  Follow up Call-     12/23/2021    1:20 PM  Call back number  Post procedure Call Back phone  # [phone]  Permission to leave phone message Yes     Patient questions:  Do you have a fever, pain , or abdominal swelling? No. Pain Score  0 *  Have you tolerated food without any problems? Yes.    Have you been able to return to your normal activities? Yes.    Do you have any questions about your discharge instructions: Diet   No. Medications  No. Follow up visit  No.  Do you have questions or concerns about your Care? No.  Actions: * If pain score is 4 or above: No action needed, pain <4.

## 2021-12-24 NOTE — Telephone Encounter (Signed)
No answer, left message to call back later today, B.Bhavesh Vazquez RN. 

## 2021-12-27 ENCOUNTER — Encounter: Payer: Self-pay | Admitting: Gastroenterology
